# Patient Record
Sex: Male | Born: 1935 | Race: Black or African American | Hispanic: No | Marital: Married | Smoking: Never smoker
Health system: Southern US, Community
[De-identification: ages and names within clinical notes are randomized; demographics above are authoritative.]

## PROBLEM LIST (undated history)

## (undated) DIAGNOSIS — Z8673 Personal history of transient ischemic attack (TIA), and cerebral infarction without residual deficits: Secondary | ICD-10-CM

## (undated) DIAGNOSIS — I10 Essential (primary) hypertension: Secondary | ICD-10-CM

## (undated) DIAGNOSIS — I480 Paroxysmal atrial fibrillation: Secondary | ICD-10-CM

## (undated) DIAGNOSIS — C61 Malignant neoplasm of prostate: Secondary | ICD-10-CM

## (undated) DIAGNOSIS — I8390 Asymptomatic varicose veins of unspecified lower extremity: Secondary | ICD-10-CM

## (undated) DIAGNOSIS — K5790 Diverticulosis of intestine, part unspecified, without perforation or abscess without bleeding: Secondary | ICD-10-CM

## (undated) DIAGNOSIS — K279 Peptic ulcer, site unspecified, unspecified as acute or chronic, without hemorrhage or perforation: Secondary | ICD-10-CM

## (undated) DIAGNOSIS — N529 Male erectile dysfunction, unspecified: Secondary | ICD-10-CM

## (undated) DIAGNOSIS — R112 Nausea with vomiting, unspecified: Secondary | ICD-10-CM

## (undated) HISTORY — DX: Peptic ulcer, site unspecified, unspecified as acute or chronic, without hemorrhage or perforation: K27.9

## (undated) HISTORY — PX: VARICOSE VEIN SURGERY: SHX832

## (undated) HISTORY — DX: Nausea with vomiting, unspecified: R11.2

## (undated) HISTORY — DX: Diverticulosis of intestine, part unspecified, without perforation or abscess without bleeding: K57.90

## (undated) HISTORY — DX: Essential (primary) hypertension: I10

## (undated) HISTORY — PX: OTHER SURGICAL HISTORY: SHX169

## (undated) HISTORY — DX: Asymptomatic varicose veins of unspecified lower extremity: I83.90

## (undated) HISTORY — PX: BACK SURGERY: SHX140

## (undated) HISTORY — PX: ROTATOR CUFF REPAIR: SHX139

## (undated) HISTORY — DX: Personal history of transient ischemic attack (TIA), and cerebral infarction without residual deficits: Z86.73

## (undated) HISTORY — DX: Paroxysmal atrial fibrillation: I48.0

## (undated) HISTORY — DX: Male erectile dysfunction, unspecified: N52.9

## (undated) HISTORY — DX: Malignant neoplasm of prostate: C61

---

## 1997-07-11 ENCOUNTER — Ambulatory Visit (HOSPITAL_COMMUNITY): Admission: RE | Admit: 1997-07-11 | Discharge: 1997-07-11 | Payer: Self-pay | Admitting: Cardiology

## 1998-09-19 ENCOUNTER — Ambulatory Visit (HOSPITAL_COMMUNITY): Admission: RE | Admit: 1998-09-19 | Discharge: 1998-09-19 | Payer: Self-pay | Admitting: Neurosurgery

## 1998-09-19 ENCOUNTER — Encounter: Payer: Self-pay | Admitting: Neurosurgery

## 1998-10-03 ENCOUNTER — Encounter: Payer: Self-pay | Admitting: Neurosurgery

## 1998-10-03 ENCOUNTER — Ambulatory Visit (HOSPITAL_COMMUNITY): Admission: RE | Admit: 1998-10-03 | Discharge: 1998-10-03 | Payer: Self-pay | Admitting: Neurosurgery

## 1998-11-01 ENCOUNTER — Encounter: Payer: Self-pay | Admitting: Neurosurgery

## 1998-11-01 ENCOUNTER — Ambulatory Visit (HOSPITAL_COMMUNITY): Admission: RE | Admit: 1998-11-01 | Discharge: 1998-11-01 | Payer: Self-pay | Admitting: Neurosurgery

## 1998-11-22 ENCOUNTER — Ambulatory Visit (HOSPITAL_COMMUNITY): Admission: RE | Admit: 1998-11-22 | Discharge: 1998-11-22 | Payer: Self-pay | Admitting: Neurosurgery

## 1998-11-22 ENCOUNTER — Encounter: Payer: Self-pay | Admitting: Neurosurgery

## 1999-01-25 ENCOUNTER — Encounter: Admission: RE | Admit: 1999-01-25 | Discharge: 1999-01-25 | Payer: Self-pay | Admitting: Orthopedic Surgery

## 1999-01-25 ENCOUNTER — Encounter: Payer: Self-pay | Admitting: Orthopedic Surgery

## 1999-05-13 ENCOUNTER — Encounter: Admission: RE | Admit: 1999-05-13 | Discharge: 1999-05-13 | Payer: Self-pay | Admitting: Orthopedic Surgery

## 1999-05-13 ENCOUNTER — Encounter: Payer: Self-pay | Admitting: Orthopedic Surgery

## 1999-05-14 ENCOUNTER — Ambulatory Visit (HOSPITAL_BASED_OUTPATIENT_CLINIC_OR_DEPARTMENT_OTHER): Admission: RE | Admit: 1999-05-14 | Discharge: 1999-05-14 | Payer: Self-pay | Admitting: Orthopedic Surgery

## 1999-05-29 ENCOUNTER — Encounter: Admission: RE | Admit: 1999-05-29 | Discharge: 1999-07-31 | Payer: Self-pay | Admitting: Orthopedic Surgery

## 2011-05-02 ENCOUNTER — Other Ambulatory Visit: Payer: Self-pay | Admitting: Family Medicine

## 2011-05-02 ENCOUNTER — Ambulatory Visit
Admission: RE | Admit: 2011-05-02 | Discharge: 2011-05-02 | Disposition: A | Discharge status: Home / Self Care | Payer: Medicare Other | Source: Ambulatory Visit | Attending: Family Medicine | Admitting: Family Medicine

## 2011-05-02 DIAGNOSIS — M79605 Pain in left leg: Secondary | ICD-10-CM

## 2011-05-02 DIAGNOSIS — M25552 Pain in left hip: Secondary | ICD-10-CM

## 2011-05-02 DIAGNOSIS — W19XXXA Unspecified fall, initial encounter: Secondary | ICD-10-CM

## 2013-01-20 IMAGING — CR DG FEMUR 2V*L*
4 series · 4 of 4 positions shown · non-contrast
Comparison: No priors.

CLINICAL DATA: History of fall complaining of left hip and thigh
pain.

LEFT FEMUR - 2 VIEW

[view not recorded (1 of 4)]
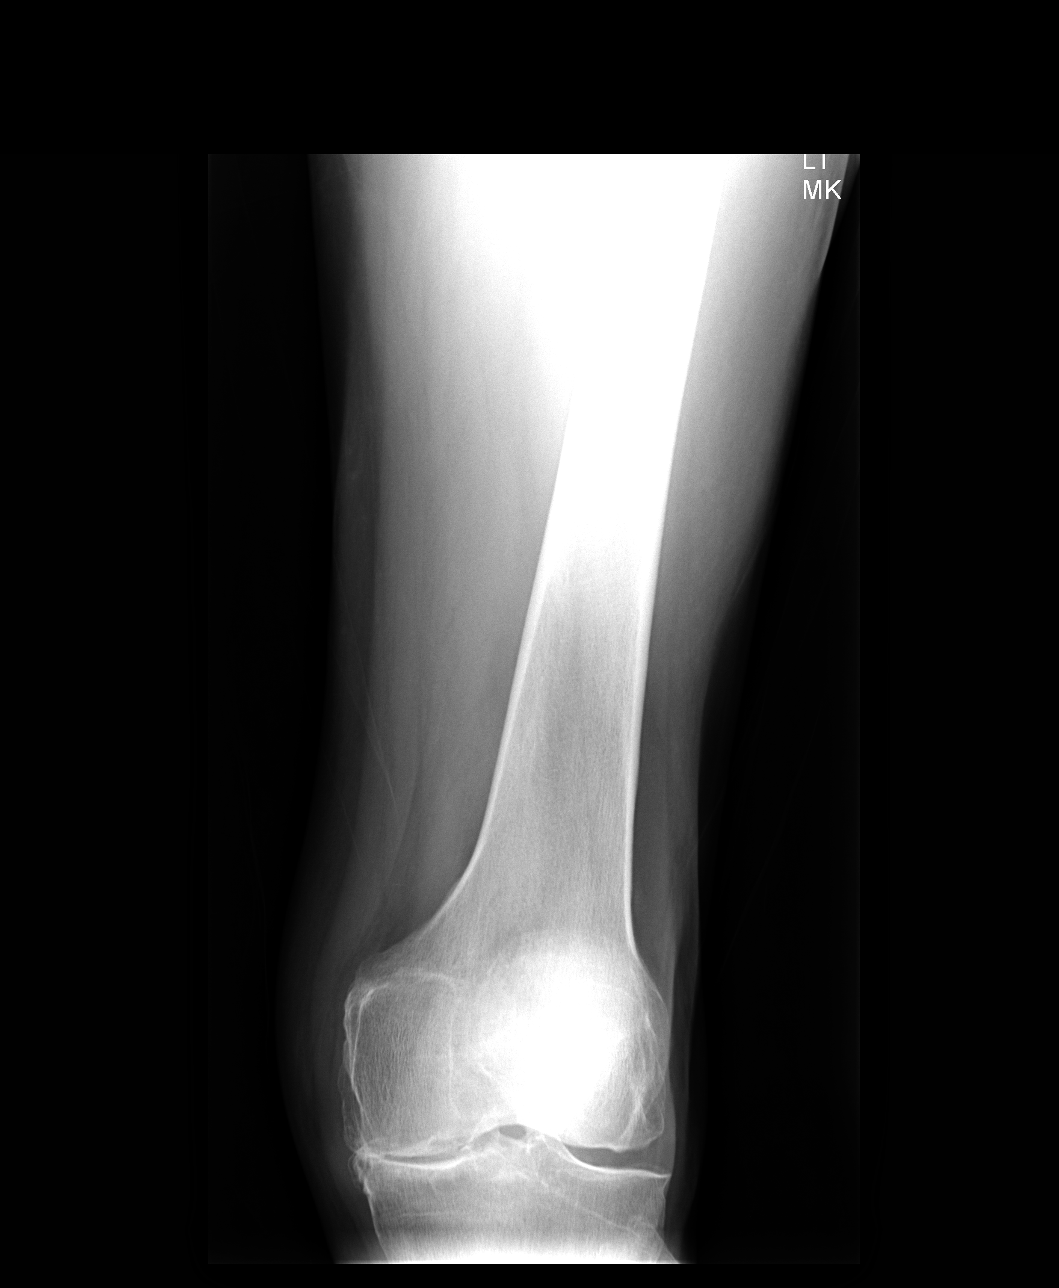

[view not recorded (2 of 4)]
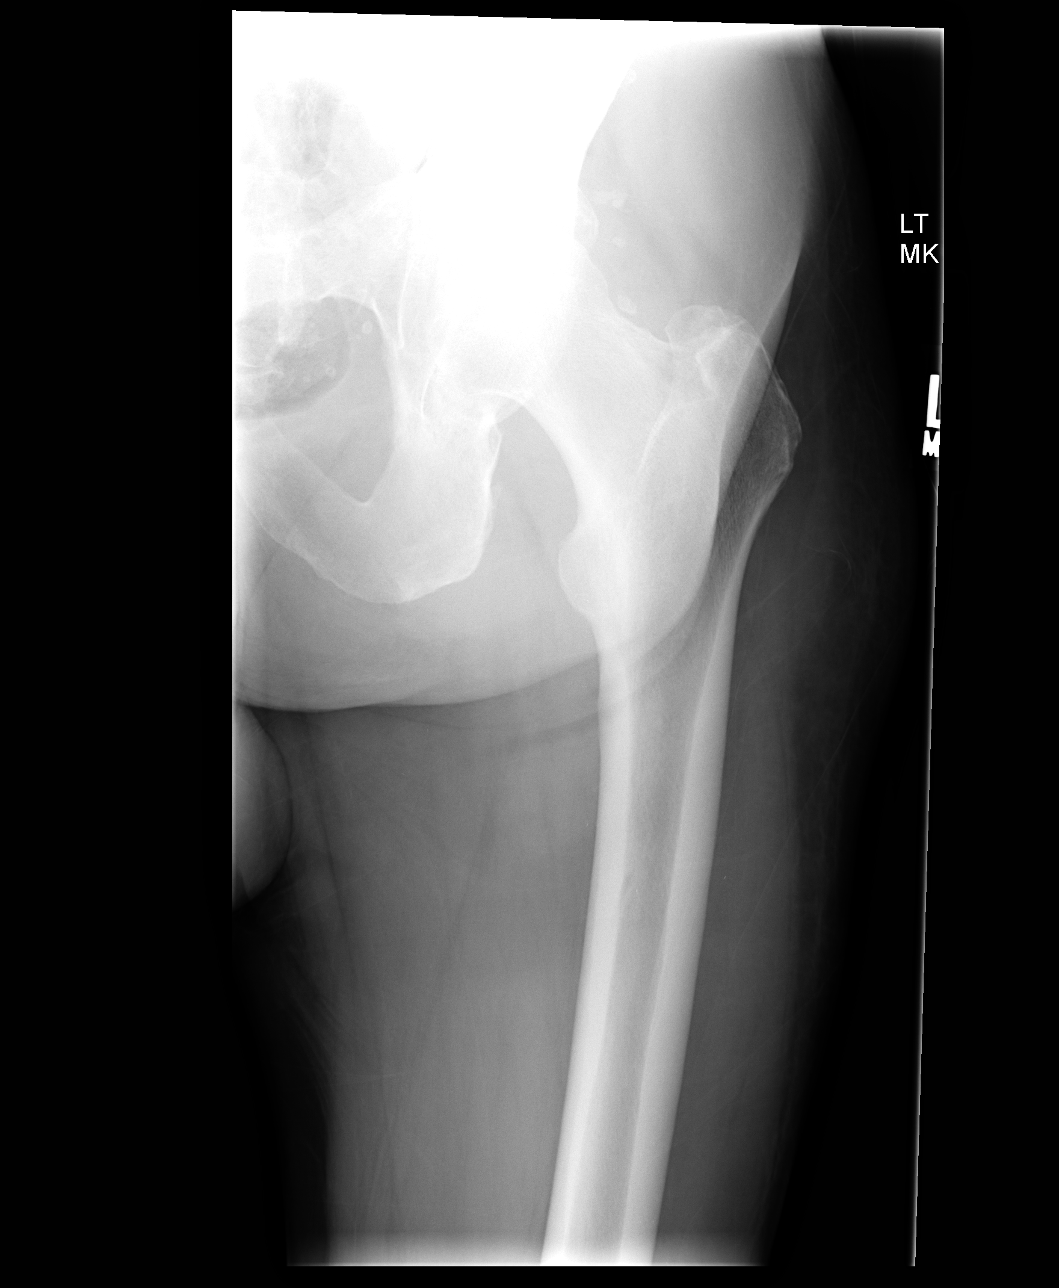

[view not recorded (3 of 4)]
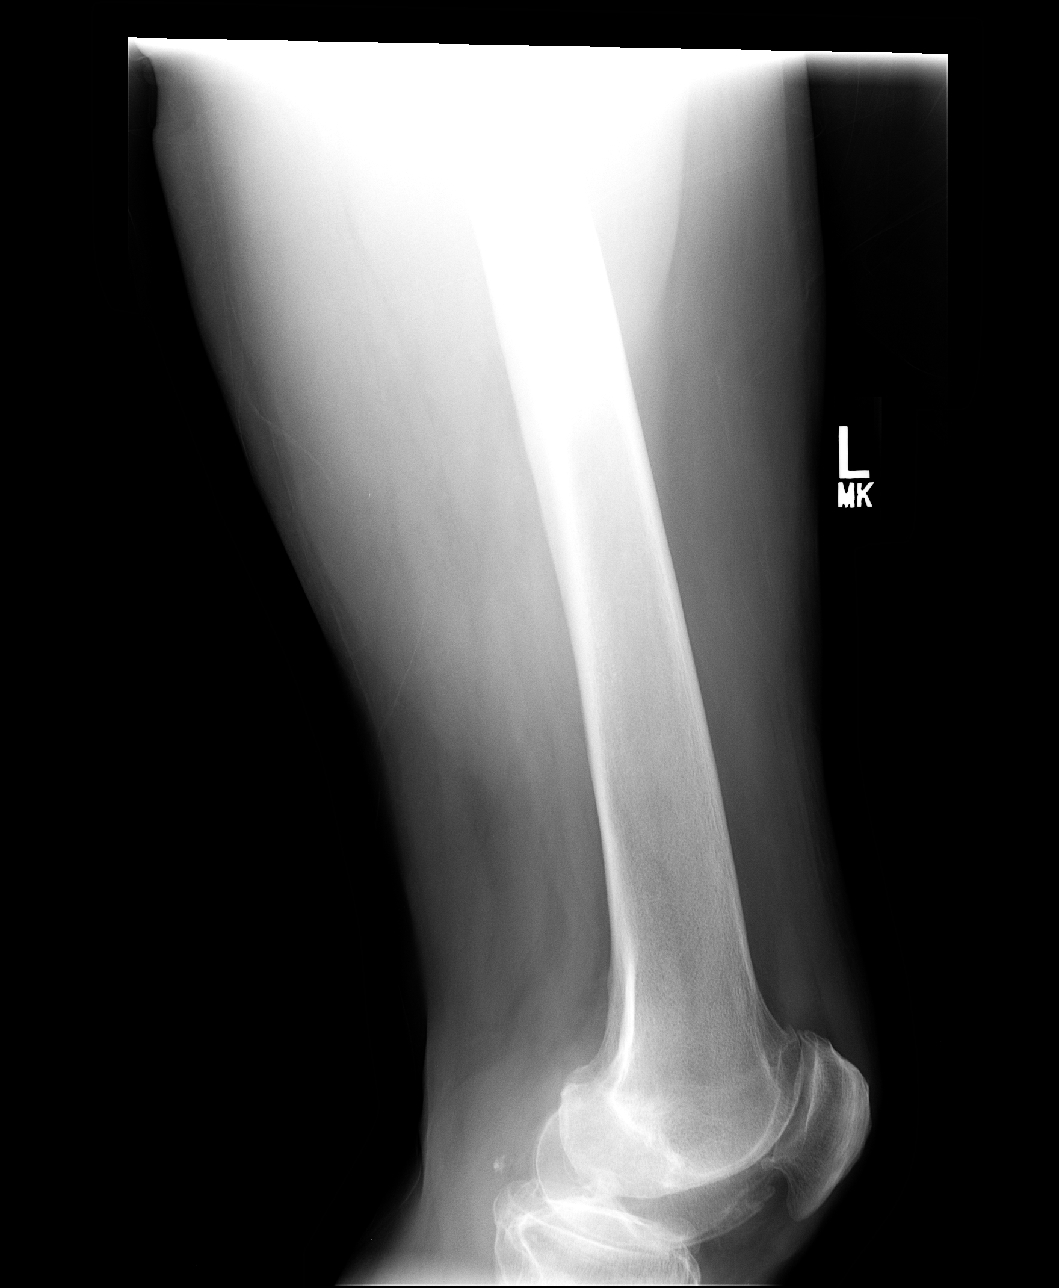

[view not recorded (4 of 4)]
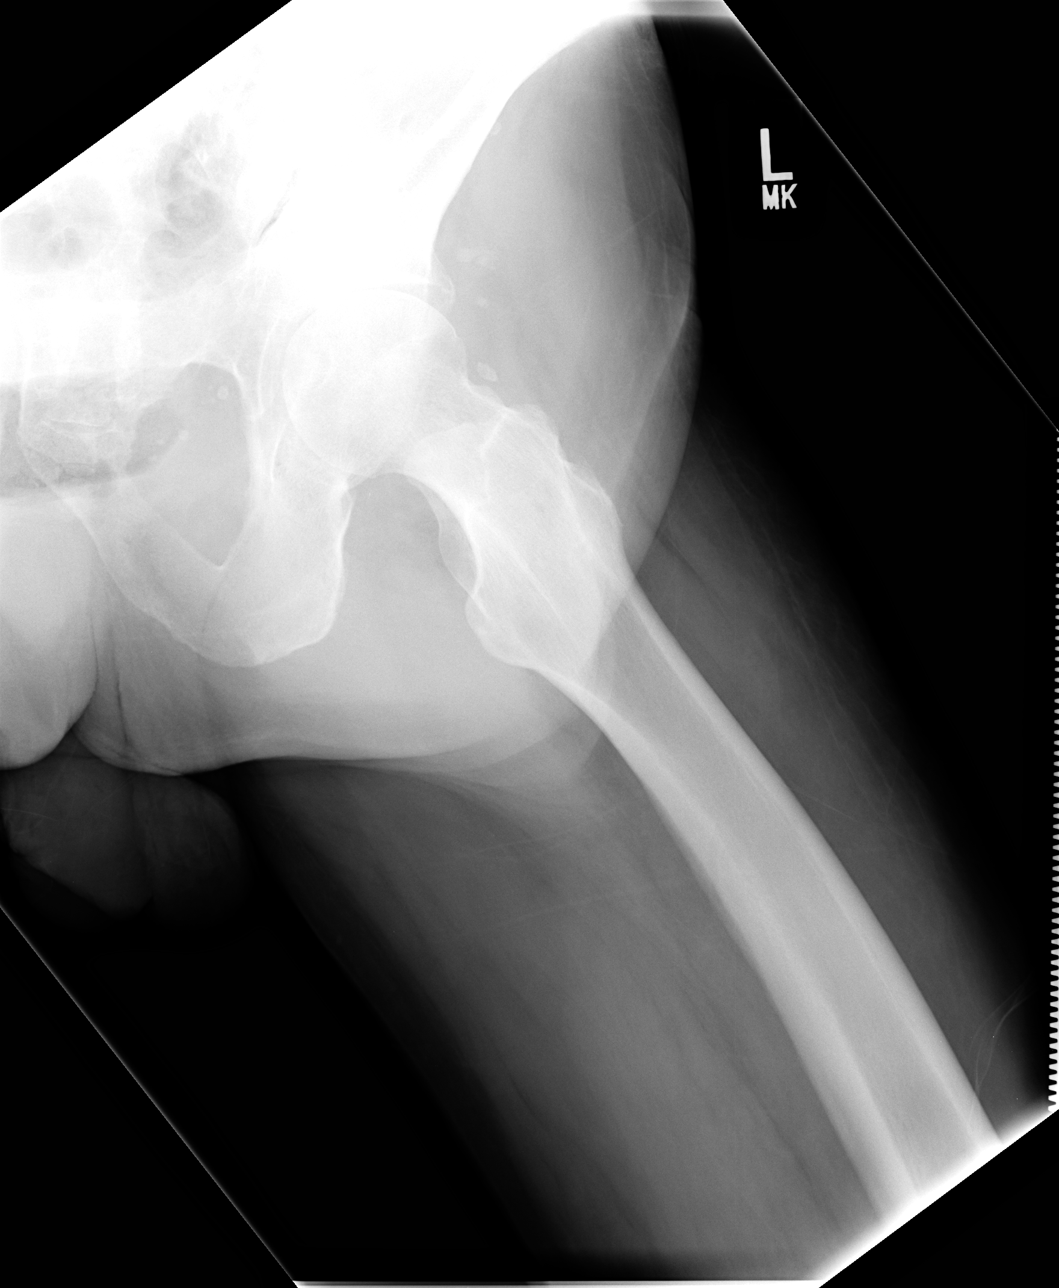

[4 of 4 positions shown; findings below may reference images not displayed]

FINDINGS: AP and lateral views of the left femur demonstrate no
evidence of acute fracture.  The hip is located.  Extensive
degenerative changes are noted in the knee joint, with subchondral
sclerosis, subchondral cyst formation and, joint space narrowing
and osteophyte formation, most pronounced in the medial and
patellofemoral compartments, consistent with advanced
osteoarthritis.  Similar changes are present to a lesser degree in
the superior aspect of the left hip joint.
IMPRESSION: 1.  Negative for acute fracture.
2.  Changes of osteoarthritis in the left knee and hip, as detailed
above.

## 2013-01-20 IMAGING — CR DG HIP (WITH OR WITHOUT PELVIS) 2-3V*L*
2 series · 2 of 2 positions shown · non-contrast
Comparison: No priors.

CLINICAL DATA: History of recent fall complaining of left-sided hip
and leg pain.

LEFT HIP - COMPLETE 2+ VIEW

[view not recorded (1 of 2)]
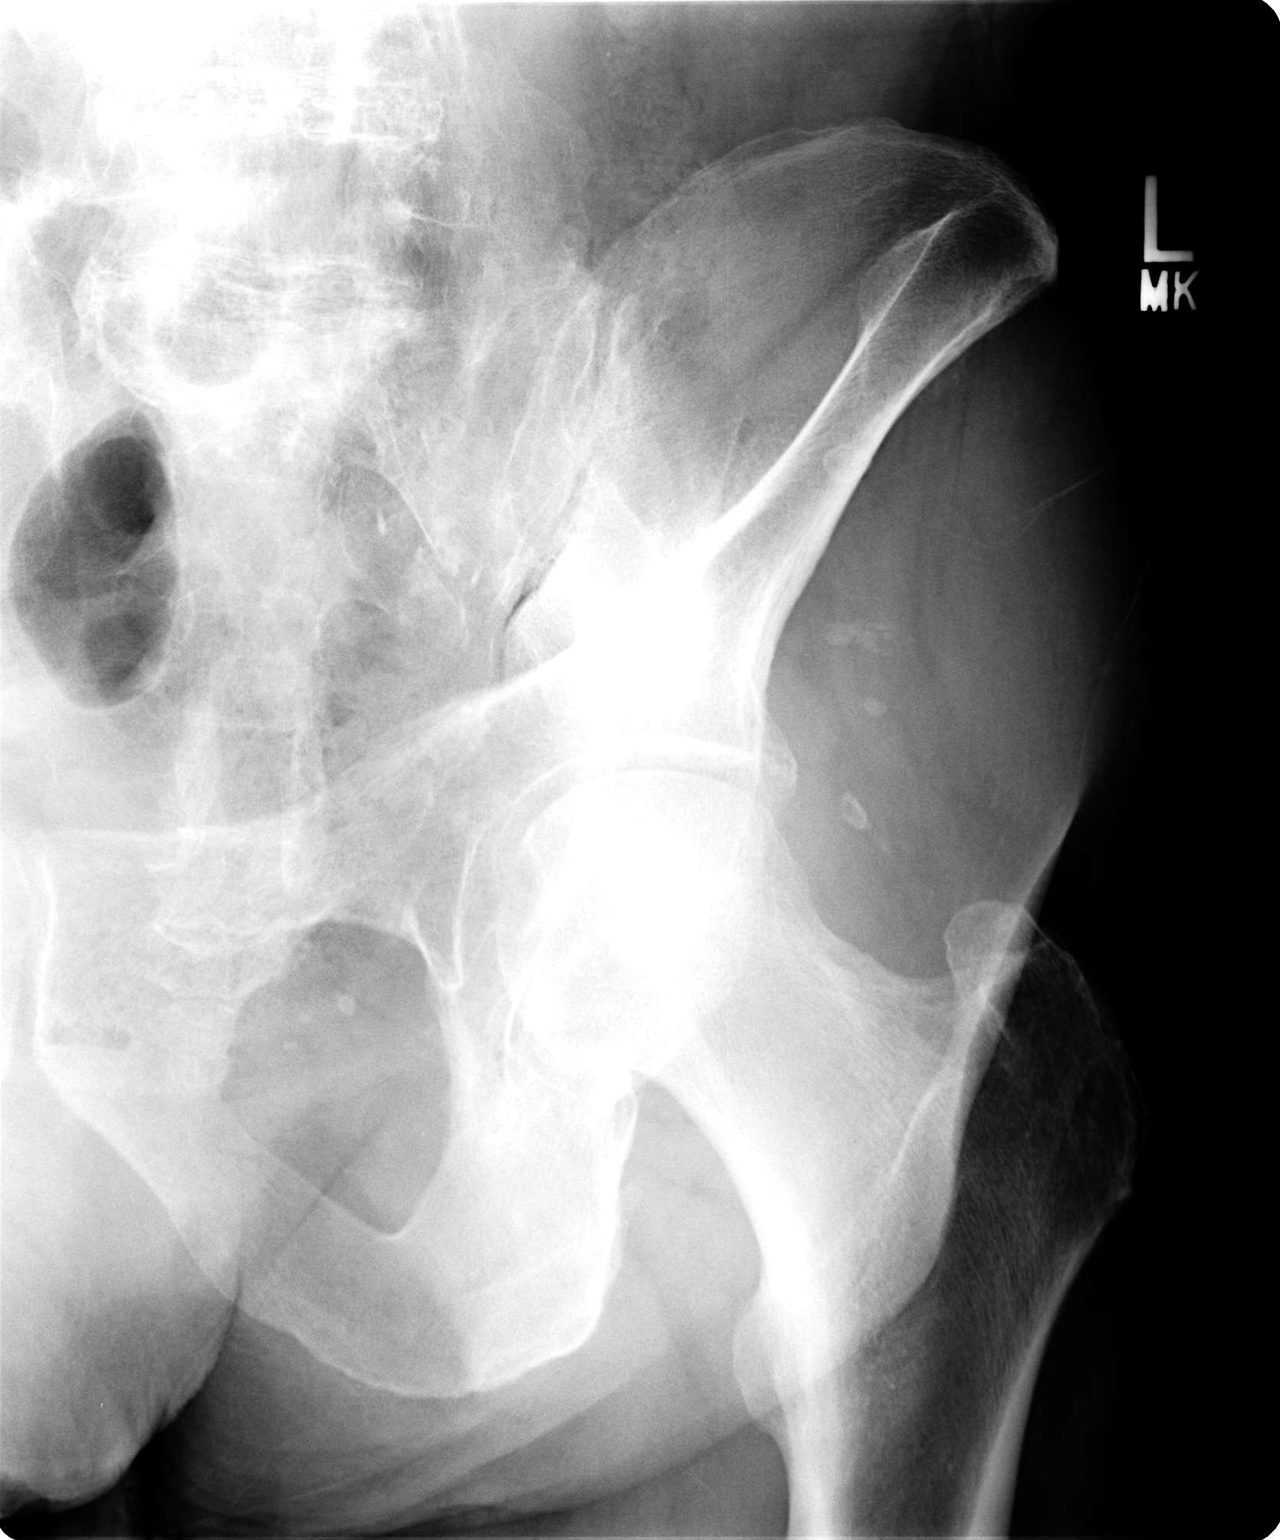

[view not recorded (2 of 2)]
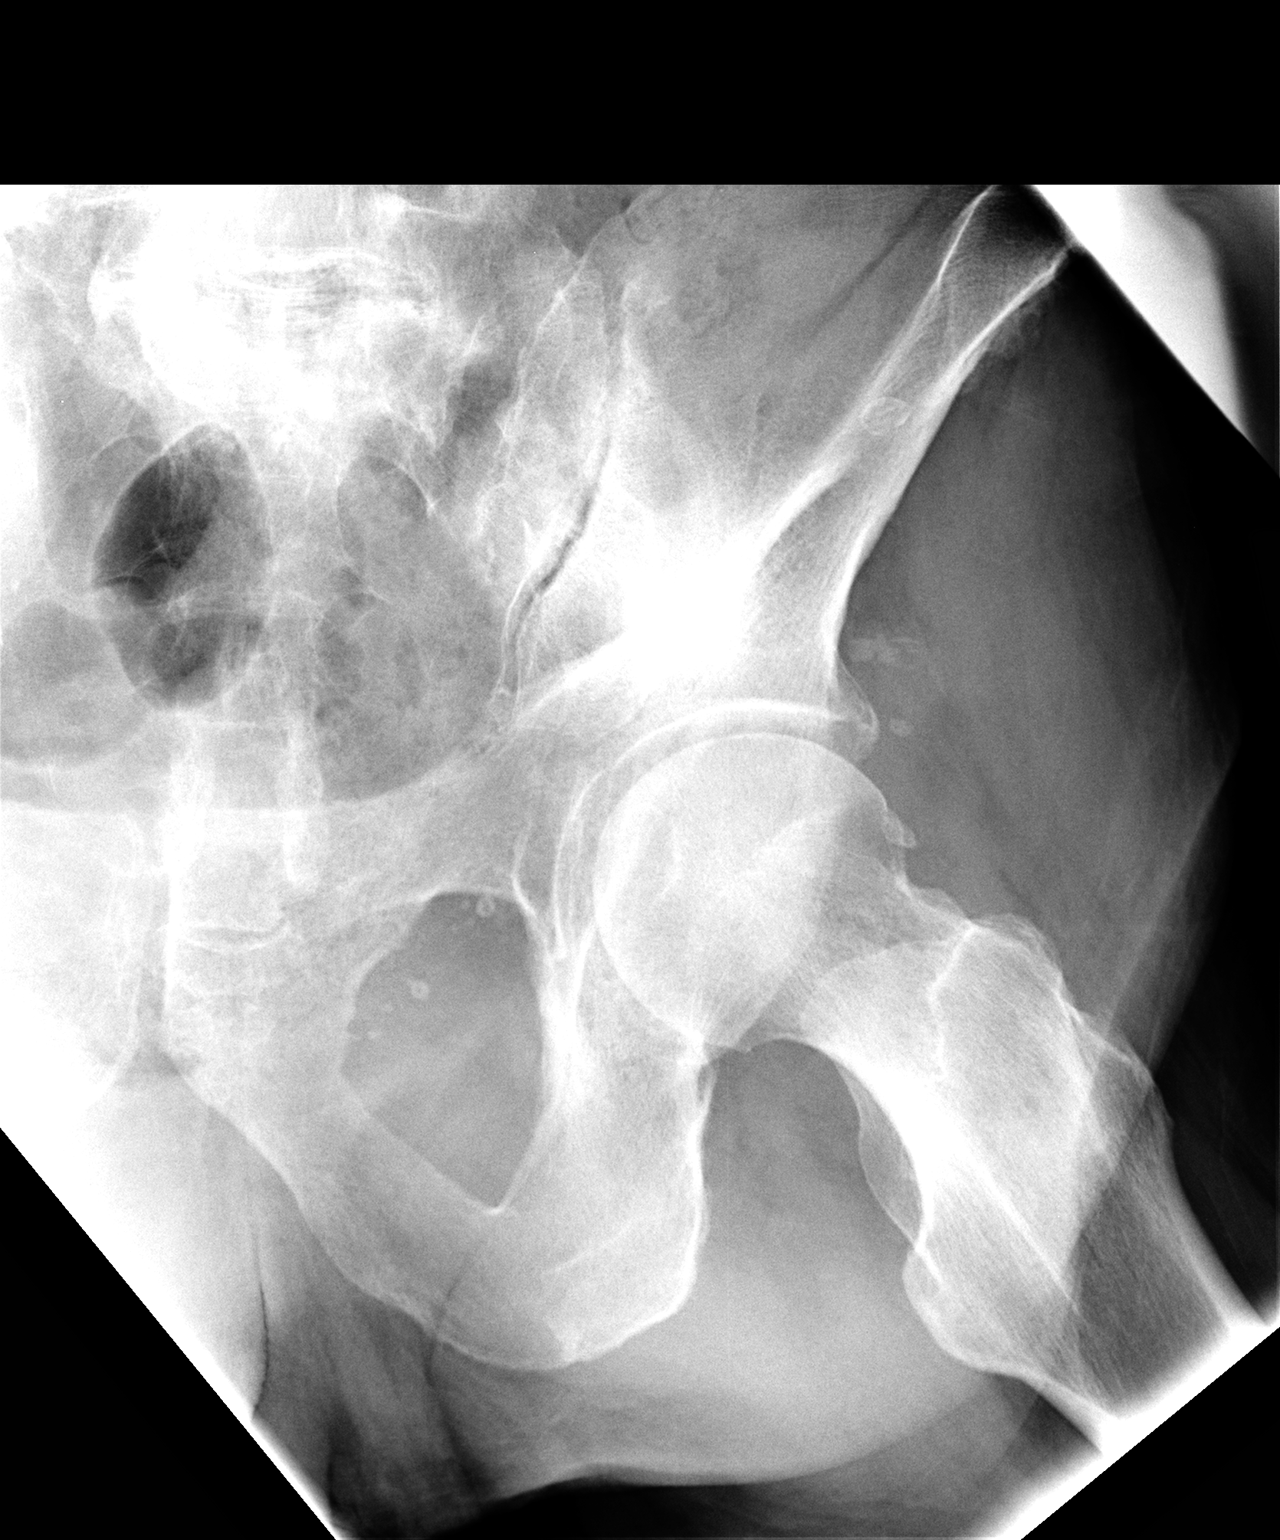

[2 of 2 positions shown; findings below may reference images not displayed]

FINDINGS: AP lateral views of the left hip demonstrate no acute
fracture, subluxation or dislocation.  Minimal joint space
narrowing and subchondral sclerosis are appreciated the superior
aspect of the hip joint, consistent with mild osteoarthritis.
Multiple dystrophic soft tissue calcifications are noted.
IMPRESSION: 1.  No acute radiographic abnormality of the left hip.
2.  Mild osteoarthritis of the left hip joint.

## 2013-04-08 ENCOUNTER — Encounter (HOSPITAL_COMMUNITY): Admission: RE | Payer: Self-pay | Source: Ambulatory Visit

## 2013-04-08 ENCOUNTER — Ambulatory Visit (HOSPITAL_COMMUNITY): Admission: RE | Admit: 2013-04-08 | Payer: Medicare PPO | Source: Ambulatory Visit | Admitting: Oral Surgery

## 2013-04-08 SURGERY — CYST REMOVAL
Anesthesia: General

## 2014-08-07 ENCOUNTER — Encounter: Payer: Self-pay | Admitting: *Deleted

## 2015-03-28 DIAGNOSIS — N183 Chronic kidney disease, stage 3 unspecified: Secondary | ICD-10-CM | POA: Insufficient documentation

## 2016-01-26 DIAGNOSIS — D500 Iron deficiency anemia secondary to blood loss (chronic): Secondary | ICD-10-CM | POA: Insufficient documentation

## 2016-03-20 DIAGNOSIS — I10 Essential (primary) hypertension: Secondary | ICD-10-CM | POA: Insufficient documentation

## 2019-01-03 DIAGNOSIS — G8194 Hemiplegia, unspecified affecting left nondominant side: Secondary | ICD-10-CM | POA: Insufficient documentation

## 2019-01-07 DIAGNOSIS — I4891 Unspecified atrial fibrillation: Secondary | ICD-10-CM | POA: Insufficient documentation

## 2019-05-18 DIAGNOSIS — I519 Heart disease, unspecified: Secondary | ICD-10-CM | POA: Insufficient documentation

## 2019-06-11 ENCOUNTER — Ambulatory Visit: Payer: Self-pay

## 2020-06-13 DIAGNOSIS — I7410 Embolism and thrombosis of unspecified parts of aorta: Secondary | ICD-10-CM | POA: Insufficient documentation

## 2020-07-11 ENCOUNTER — Telehealth: Payer: Medicare PPO

## 2020-07-11 NOTE — Telephone Encounter (Signed)
Referral notes sent from [facility], Phone #: [phone], Fax #: [phone]   Notes sent to scheduling

## 2020-07-16 ENCOUNTER — Encounter: Payer: Self-pay | Admitting: Interventional Cardiology

## 2020-07-16 NOTE — Telephone Encounter (Signed)
Error

## 2020-08-31 DIAGNOSIS — F0150 Vascular dementia without behavioral disturbance: Secondary | ICD-10-CM | POA: Insufficient documentation

## 2020-08-31 DIAGNOSIS — I679 Cerebrovascular disease, unspecified: Secondary | ICD-10-CM | POA: Insufficient documentation

## 2020-09-26 NOTE — Progress Notes (Signed)
Cardiology Office Note:    Date:  09/27/2020   ID:  [name], DOB 02/18/1922, MRN [id number]  PCP:  Dennis Ace, MD  Cardiologist:  None   Referring MD: Dennis Mccormick,*   Chief Complaint  Patient presents with   Atrial Fibrillation   Follow-up    CVA    History of Present Illness:    Dennis Mccormick is a 87 y.o. male with a hx of permanent atrial fibrillation, CVA, anticoagulation stopped due to urinary rectal bleeding, and essential hypertension.  Dennis Mccormick recommendation 05/2020: "The patient is doing well he has permanent atrial fibrillation which does of course present a stroke risk however he had both rectal and urinary bleeding from anticoagulation he is now on low-dose aspirin which will not offer him significant protection against thromboembolic stroke. He is having some stomach complaints so I advised that they stop this his recent Zio patch showed persistent AF with generally controlled ventricular rate. He had a 2-1/2-second pauses during sleep hours. Nothing during the day I instructed his attendant to resume his Toprol-XL 25 mg a day to control his daytime ventricular rate. He will follow-up in 1 year".  I have not seen Dennis Mccormick in more than 8 years.  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  Further investigation identified atrial fibrillation at the diagnosis of embolic CVA was identified.  Anticoagulation therapy was started but had to be discontinued because anticoagulation because both rectal and urinary bleeding.  He is now on aspirin daily.  He had a monitor performed by Dennis Mccormick.  He was left on metoprolol 25 mg daily (succinate).  Initially the metoprolol was discontinued because of a 2.5-second pause seen on the monitor while the patient was asleep.  When the monitor was officially interpreted by Dennis Mccormick,  he felt that continuing the beta-blocker was necessary to control the average heart rate.  We had discussion concerning management and the need for metoprolol.  Also discussed the absence of action related to pauses of up to 4 seconds when patients are asleep.    Past Medical History:  Diagnosis Date   Diverticulosis    ED (erectile dysfunction)    H/O: stroke    HTN (hypertension)    Nausea & vomiting    Paroxysmal A-fib (HCC)    Peptic ulcer    Prostate cancer ([facility])    Varicose veins     Past Surgical History:  Procedure Laterality Date   BACK SURGERY     x5   peptic ulcer surgery     ROTATOR CUFF REPAIR Right    VARICOSE VEIN SURGERY      Current Medications: Current Meds  Medication Sig   aspirin EC 81 MG tablet Take 81 mg by mouth daily. Swallow whole.   atorvastatin (LIPITOR) 40 MG tablet Take 40 mg by mouth daily.   cefdinir (OMNICEF) 300 MG capsule Take 300 mg by mouth daily.   Fructose-Dextrose-Phosphor Acd (EMETROL PO) Take by mouth.   loratadine (CLARITIN) 10 MG tablet Take 10 mg by mouth daily.   meclizine (ANTIVERT) 12.5 MG tablet Take 12.5 mg by mouth 2 (two) times daily as needed for dizziness.   metoprolol succinate (TOPROL-XL) 25 MG 24 hr tablet Take 25 mg by mouth daily.   tiZANidine (ZANAFLEX) 2 MG tablet Take by mouth 2 (  two) times daily as needed for muscle spasms.   triamterene-hydrochlorothiazide (MAXZIDE-25) 37.5-25 MG tablet Take 1 tablet by mouth daily.     Allergies:   Donepezil   Social History   Socioeconomic History   Marital status: Married    Spouse name: Not on file   Number of children: Not on file   Years of education: Not on file   Highest education level: Not on file  Occupational History   Not on file  Tobacco Use   Smoking status: Never   Smokeless tobacco: Never  Substance and Sexual Activity   Alcohol use: No    Alcohol/week: 0.0 standard drinks   Drug use: No   Sexual activity: Not on file  Other Topics Concern    Not on file  Social History Narrative   Not on file   Social Determinants of Health   Financial Resource Strain: Not on file  Food Insecurity: Not on file  Transportation Needs: Not on file  Physical Activity: Not on file  Stress: Not on file  Social Connections: Not on file     Family History: The patient's family history includes CVA in his father and mother.  ROS:   Please see the history of present illness.    He denies chest pain.  He is able to lie flat.  There is no lower extremity swelling.  He denies palpitations and fluttering.  He has had no recurrence of neurological symptoms.  All other systems reviewed and are negative.  EKGs/Labs/Other Studies Reviewed:    The following studies were reviewed today: No new cardiac data.  Please see the note above from Dennis Mccormick.  EKG:  EKG atrial fibrillation, controlled ventricular response of 75 bpm.  Nonspecific T wave abnormality.  Prominent voltage.  There is no prior tracing available for comparison.  Recent Labs: No results found for requested labs within last 8760 hours.  Recent Lipid Panel No results found for: CHOL, TRIG, HDL, CHOLHDL, VLDL, LDLCALC, LDLDIRECT  Physical Exam:    VS:  BP (!) 148/82   Ht 5' (1.524 m)   Wt 133 lb 6.4 oz (60.5 kg)   SpO2 100%   BMI 26.05 kg/m     Wt Readings from Last 3 Encounters:  09/27/20 133 lb 6.4 oz (60.5 kg)     GEN: Under and underweight for height.  BMI though is 26.. No acute distress HEENT: Normal NECK: No JVD. LYMPHATICS: No lymphadenopathy CARDIAC: No murmur. IIRR no gallop, or edema. VASCULAR:  Normal Pulses. No bruits. RESPIRATORY:  Clear to auscultation without rales, wheezing or rhonchi  ABDOMEN: Soft, non-tender, non-distended, No pulsatile mass, MUSCULOSKELETAL: No deformity  SKIN: Warm and dry NEUROLOGIC:  Alert and oriented x 3 PSYCHIATRIC:  Normal affect   ASSESSMENT:    1. Atrial fibrillation, unspecified type ([place])   2. Asymptomatic LV  dysfunction   3. Cerebrovascular disease   4. Essential hypertension   5. Stage 3b chronic kidney disease ([place])   6. Vascular dementia without behavioral disturbance ([place])   7. At risk for hemorrhage associated with anticoagulation therapy    PLAN:    In order of problems listed above:  The rate is controlled and he should continue metoprolol succinate 25 mg/day. Will try to uncover the echocardiogram performed at [facility].  He is said to have "asymptomatic LV dysfunction".  Not inclined to perform echocardiogram at the current time. Followed by neurology. Blood pressures well controlled Blood work is not done to evaluate kidney function  This is a diagnosis carried forward from the [place]. Eliquis therapy was discontinued even though the patient has a very high CHADS2 score because of unacceptable urinary and rectal bleeding.  The pros and cons of anticoagulation were discussed in detail with the family and they agreed to proceed with continued aspirin therapy  64-month follow-up relative to atrial fibrillation rate control and overall condition.   Medication Adjustments/Labs and Tests Ordered: Current medicines are reviewed at length with the patient today.  Concerns regarding medicines are outlined above.  Orders Placed This Encounter  Procedures   EKG 12-Lead   No orders of the defined types were placed in this encounter.   Patient Instructions  Medication Instructions:  Your physician recommends that you continue on your current medications as directed. Please refer to the Current Medication list given to you today.  *If you need a refill on your cardiac medications before your next appointment, please call your pharmacy*   Lab Work: None If you have labs (blood work) drawn today and your tests are completely normal, you will receive your results only by: [place] (if you have MyChart) OR A paper copy in the mail If you have any lab test  that is abnormal or we need to change your treatment, we will call you to review the results.   Testing/Procedures: None   Follow-Up: At [facility], you and your health needs are our priority.  As part of our continuing mission to provide you with exceptional heart care, we have created designated Provider Care Teams.  These Care Teams include your primary Cardiologist (physician) and Advanced Practice Providers (APPs -  Physician Assistants and Nurse Practitioners) who all work together to provide you with the care you need, when you need it.  We recommend signing up for the patient portal called "MyChart".  Sign up information is provided on this After Visit Summary.  MyChart is used to connect with patients for Virtual Visits (Telemedicine).  Patients are able to view lab/test results, encounter notes, upcoming appointments, etc.  Non-urgent messages can be sent to your provider as well.   To learn more about what you can do with MyChart, go to [email or web address].    Your next appointment:   6-9 month(s)  The format for your next appointment:   In Person  Provider:   You will see one of the following Advanced Practice Providers on your designated Care Team:   Kathyrn Drown, NP Richardson Dopp, PA-C     Other Instructions     Signed, Sinclair Grooms, MD  09/27/2020 1:46 PM    [place]

## 2020-09-27 ENCOUNTER — Other Ambulatory Visit: Payer: Self-pay

## 2020-09-27 ENCOUNTER — Ambulatory Visit: Payer: Medicare HMO | Admitting: Interventional Cardiology

## 2020-09-27 ENCOUNTER — Encounter: Payer: Self-pay | Admitting: Interventional Cardiology

## 2020-09-27 VITALS — BP 148/82 | Ht 60.0 in | Wt 133.4 lb

## 2020-09-27 DIAGNOSIS — I679 Cerebrovascular disease, unspecified: Secondary | ICD-10-CM

## 2020-09-27 DIAGNOSIS — I519 Heart disease, unspecified: Secondary | ICD-10-CM

## 2020-09-27 DIAGNOSIS — Z9189 Other specified personal risk factors, not elsewhere classified: Secondary | ICD-10-CM

## 2020-09-27 DIAGNOSIS — I10 Essential (primary) hypertension: Secondary | ICD-10-CM

## 2020-09-27 DIAGNOSIS — I4891 Unspecified atrial fibrillation: Secondary | ICD-10-CM | POA: Diagnosis not present

## 2020-09-27 DIAGNOSIS — N1832 Chronic kidney disease, stage 3b: Secondary | ICD-10-CM

## 2020-09-27 DIAGNOSIS — F0150 Vascular dementia without behavioral disturbance: Secondary | ICD-10-CM

## 2020-09-27 NOTE — Patient Instructions (Signed)
Medication Instructions:  Your physician recommends that you continue on your current medications as directed. Please refer to the Current Medication list given to you today.  *If you need a refill on your cardiac medications before your next appointment, please call your pharmacy*   Lab Work: None If you have labs (blood work) drawn today and your tests are completely normal, you will receive your results only by: [place] (if you have MyChart) OR A paper copy in the mail If you have any lab test that is abnormal or we need to change your treatment, we will call you to review the results.   Testing/Procedures: None   Follow-Up: At [facility], you and your health needs are our priority.  As part of our continuing mission to provide you with exceptional heart care, we have created designated Provider Care Teams.  These Care Teams include your primary Cardiologist (physician) and Advanced Practice Providers (APPs -  Physician Assistants and Nurse Practitioners) who all work together to provide you with the care you need, when you need it.  We recommend signing up for the patient portal called "MyChart".  Sign up information is provided on this After Visit Summary.  MyChart is used to connect with patients for Virtual Visits (Telemedicine).  Patients are able to view lab/test results, encounter notes, upcoming appointments, etc.  Non-urgent messages can be sent to your provider as well.   To learn more about what you can do with MyChart, go to [email or web address].    Your next appointment:   6-9 month(s)  The format for your next appointment:   In Person  Provider:   You will see one of the following Advanced Practice Providers on your designated Care Team:   Kathyrn Drown, NP Richardson Dopp, PA-C     Other Instructions

## 2021-11-29 DEATH — deceased
